# Patient Record
Sex: Male | Born: 1975 | Race: Black or African American | Hispanic: No | Smoking: Never smoker
Health system: Southern US, Community
[De-identification: ages and names within clinical notes are randomized; demographics above are authoritative.]

## PROBLEM LIST (undated history)

## (undated) DIAGNOSIS — G4730 Sleep apnea, unspecified: Secondary | ICD-10-CM

---

## 2014-04-21 ENCOUNTER — Encounter (HOSPITAL_BASED_OUTPATIENT_CLINIC_OR_DEPARTMENT_OTHER): Payer: Self-pay | Admitting: Emergency Medicine

## 2014-04-21 ENCOUNTER — Emergency Department (HOSPITAL_BASED_OUTPATIENT_CLINIC_OR_DEPARTMENT_OTHER): Payer: Self-pay

## 2014-04-21 ENCOUNTER — Emergency Department (HOSPITAL_BASED_OUTPATIENT_CLINIC_OR_DEPARTMENT_OTHER)
Admission: EM | Admit: 2014-04-21 | Discharge: 2014-04-21 | Disposition: A | Discharge status: Home / Self Care | Payer: Self-pay | Attending: Emergency Medicine | Admitting: Emergency Medicine

## 2014-04-21 DIAGNOSIS — S46909A Unspecified injury of unspecified muscle, fascia and tendon at shoulder and upper arm level, unspecified arm, initial encounter: Secondary | ICD-10-CM | POA: Insufficient documentation

## 2014-04-21 DIAGNOSIS — X500XXA Overexertion from strenuous movement or load, initial encounter: Secondary | ICD-10-CM | POA: Insufficient documentation

## 2014-04-21 DIAGNOSIS — S4980XA Other specified injuries of shoulder and upper arm, unspecified arm, initial encounter: Secondary | ICD-10-CM | POA: Insufficient documentation

## 2014-04-21 DIAGNOSIS — Y9289 Other specified places as the place of occurrence of the external cause: Secondary | ICD-10-CM | POA: Insufficient documentation

## 2014-04-21 DIAGNOSIS — IMO0002 Reserved for concepts with insufficient information to code with codable children: Secondary | ICD-10-CM | POA: Insufficient documentation

## 2014-04-21 DIAGNOSIS — S46911A Strain of unspecified muscle, fascia and tendon at shoulder and upper arm level, right arm, initial encounter: Secondary | ICD-10-CM

## 2014-04-21 DIAGNOSIS — Y939 Activity, unspecified: Secondary | ICD-10-CM | POA: Insufficient documentation

## 2014-04-21 MED ORDER — IBUPROFEN 800 MG PO TABS: 800.0000 mg | ORAL_TABLET | Freq: Three times a day (TID) | ORAL | Status: DC

## 2014-04-21 MED ORDER — HYDROCODONE-ACETAMINOPHEN 5-325 MG PO TABS: 2.0000 | ORAL_TABLET | ORAL | Status: DC | PRN

## 2014-04-21 NOTE — Discharge Instructions (Signed)

## 2014-04-21 NOTE — ED Provider Notes (Signed)
CSN: [id number]     Arrival date & time 04/21/14  1816 History   First MD Initiated Contact with Patient 04/21/14 1848     Chief Complaint  Patient presents with  . Shoulder Pain     (Consider location/radiation/quality/duration/timing/severity/associated sxs/prior Treatment) Patient is a 38 y.o. male presenting with shoulder pain. The history is provided by the patient. No language interpreter was used.  Shoulder Pain This is a new problem. Episode onset: 5 days. The problem occurs constantly. The problem has been gradually worsening. Associated symptoms include myalgias. Pertinent negatives include no joint swelling or numbness. Nothing aggravates the symptoms. He has tried nothing for the symptoms. The treatment provided no relief.   Pt reports he injured shoulder reaching up when he was ina dunking booth History reviewed. No pertinent past medical history. History reviewed. No pertinent past surgical history. No family history on file. History  Substance Use Topics  . Smoking status: Never Smoker   . Smokeless tobacco: Not on file  . Alcohol Use: No    Review of Systems  Musculoskeletal: Positive for myalgias. Negative for joint swelling.  Neurological: Negative for numbness.  All other systems reviewed and are negative.     Allergies  Review of patient's allergies indicates no known allergies.  Home Medications   Prior to Admission medications   Not on File   BP 117/77  Pulse 68  Temp(Src) 98 F (36.7 C) (Oral)  Resp 16  SpO2 100% Physical Exam  Nursing note and vitals reviewed. Constitutional: He is oriented to person, place, and time. He appears well-developed and well-nourished.  HENT:  Head: Normocephalic.  Eyes: EOM are normal.  Neck: Normal range of motion.  Pulmonary/Chest: Effort normal.  Abdominal: He exhibits no distension.  Musculoskeletal: He exhibits tenderness.  Pain to palpation of left shoulder,  Decreased range of motion,   nv and ns  intact,    Neurological: He is alert and oriented to person, place, and time.  Skin: Skin is warm.  Psychiatric: He has a normal mood and affect.    ED Course  Procedures (including critical care time) Labs Review Labs Reviewed - No data to display  Imaging Review No results found.   EKG Interpretation None      MDM   Final diagnoses:  Shoulder strain, right, initial encounter    Pt counseled on shoulder sprain. Ibuprofen Hydrocodone See Dr. Pearletha Forgehudnall for recheck in 1 week     Elson AreasLeslie K Sofia, PA-C 04/21/14 2127

## 2014-04-21 NOTE — ED Notes (Signed)
Patient transported to X-ray 

## 2014-04-21 NOTE — ED Notes (Signed)
Soreness, stiffness and pain to right shoulder since Friday. Pt hit shoulder on dunking booth

## 2014-04-22 NOTE — ED Provider Notes (Signed)
Medical screening examination/treatment/procedure(s) were performed by non-physician practitioner and as supervising physician I was immediately available for consultation/collaboration.   EKG Interpretation None        Junius ArgyleForrest S Ipek Westra, MD 04/22/14 [phone]

## 2015-02-23 IMAGING — CR DG SHOULDER 2+V*R*
3 series · 3 of 3 positions shown · non-contrast
Comparison: None.

CLINICAL DATA: Stiffness and soreness the right shoulder.  Injury.

EXAM:
RIGHT SHOULDER - 2+ VIEW

[w shoulder ap internal righ]
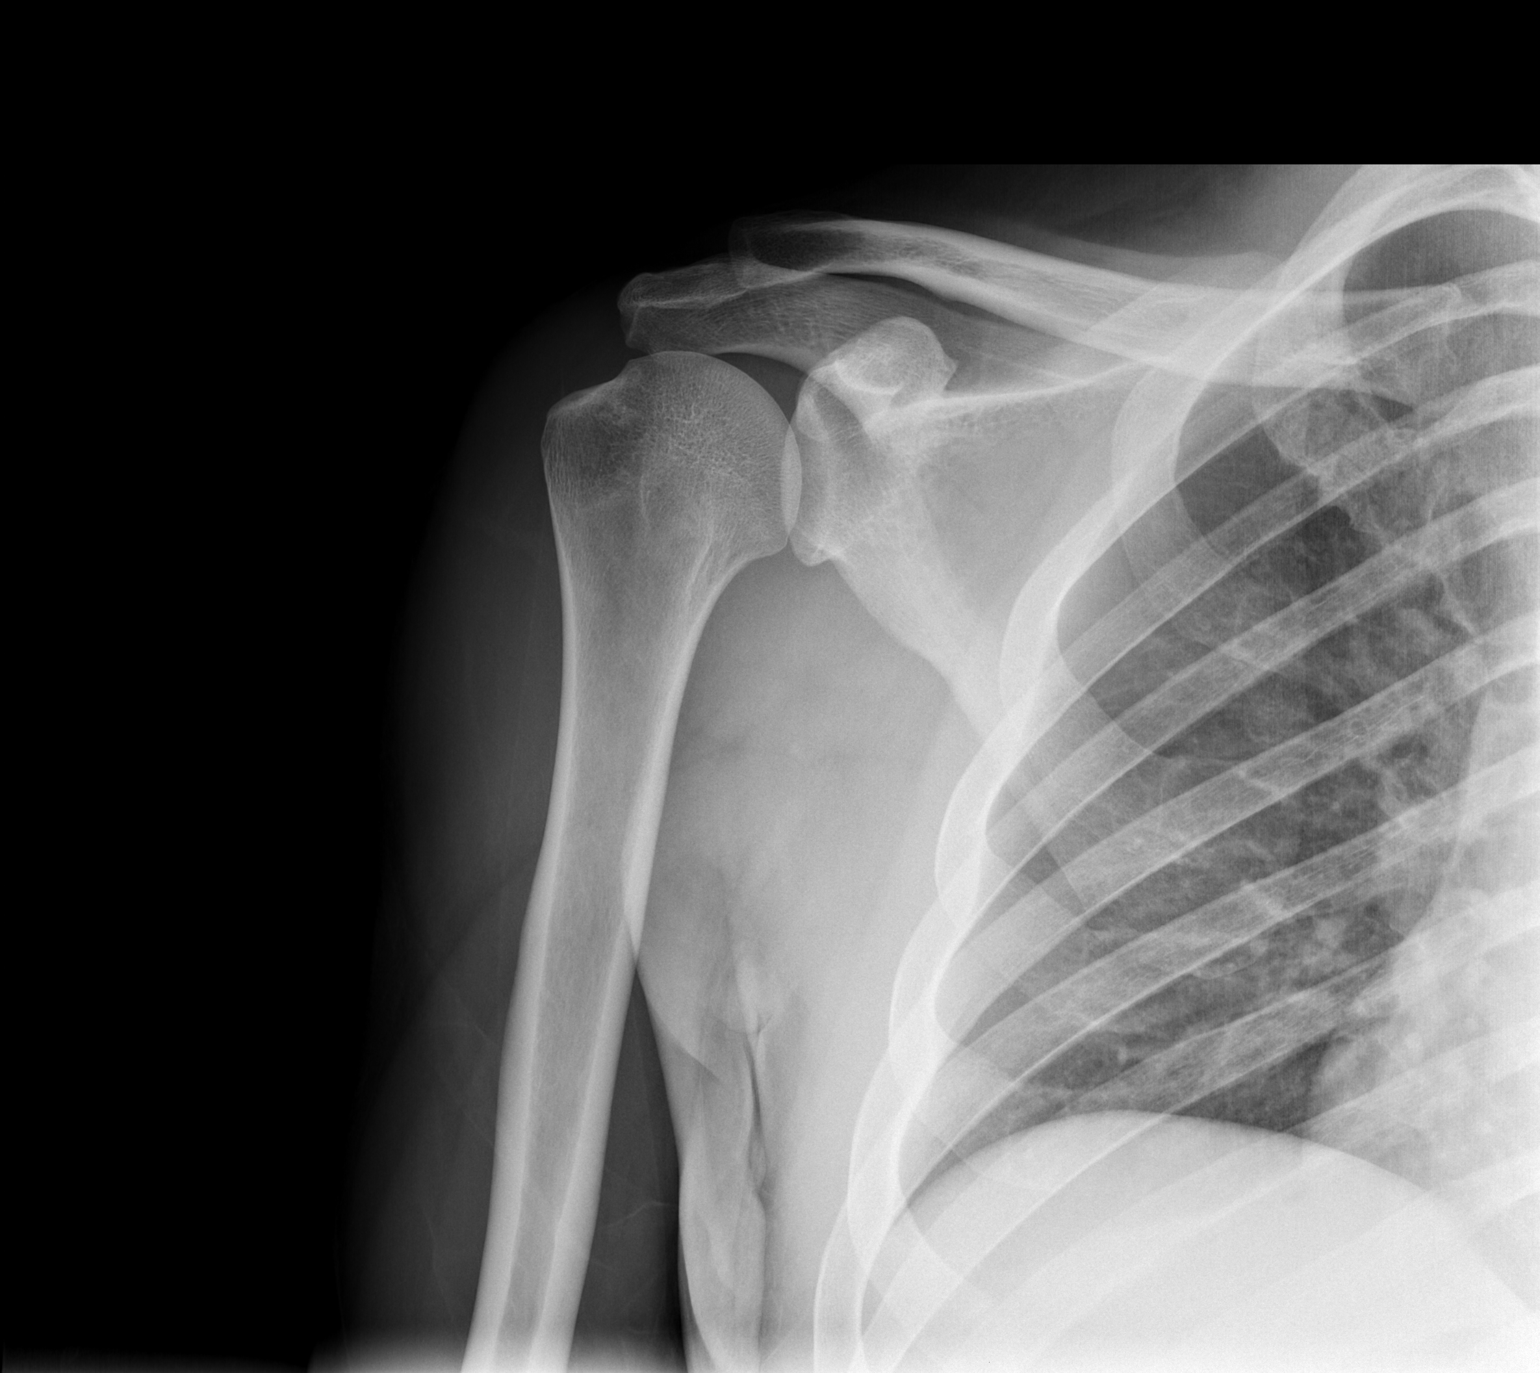

[w shoulder y view right]
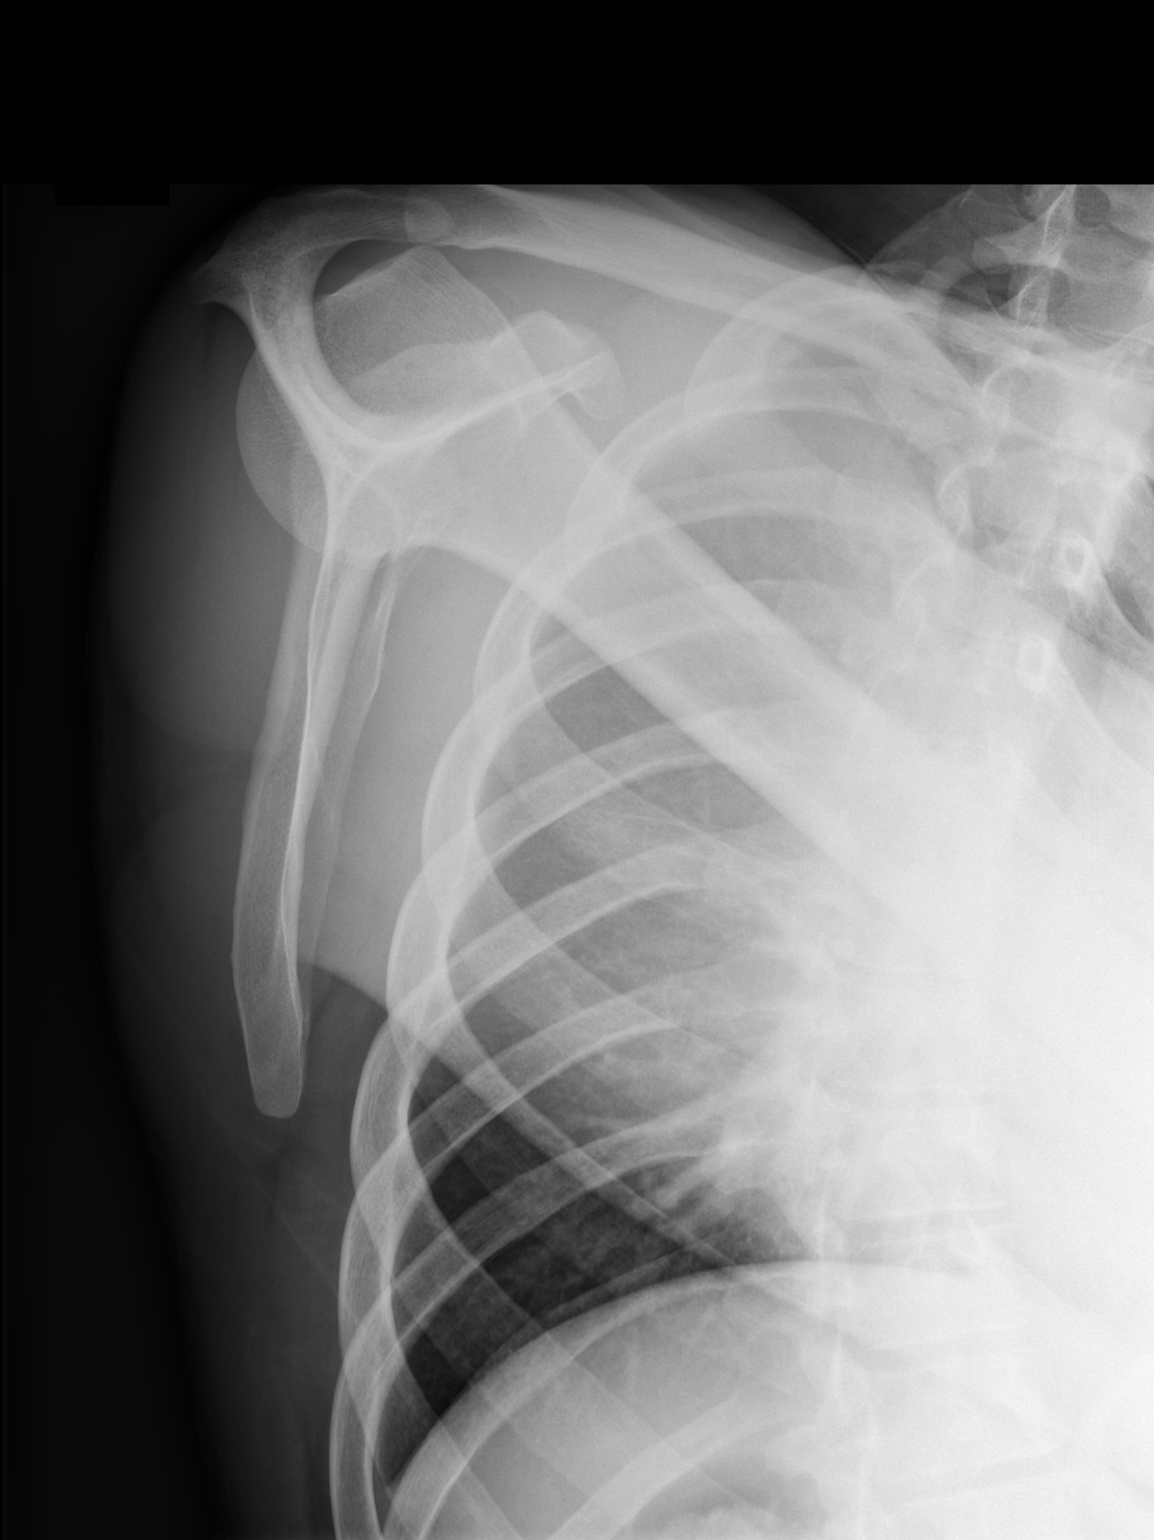

[x shoulder axillary right]
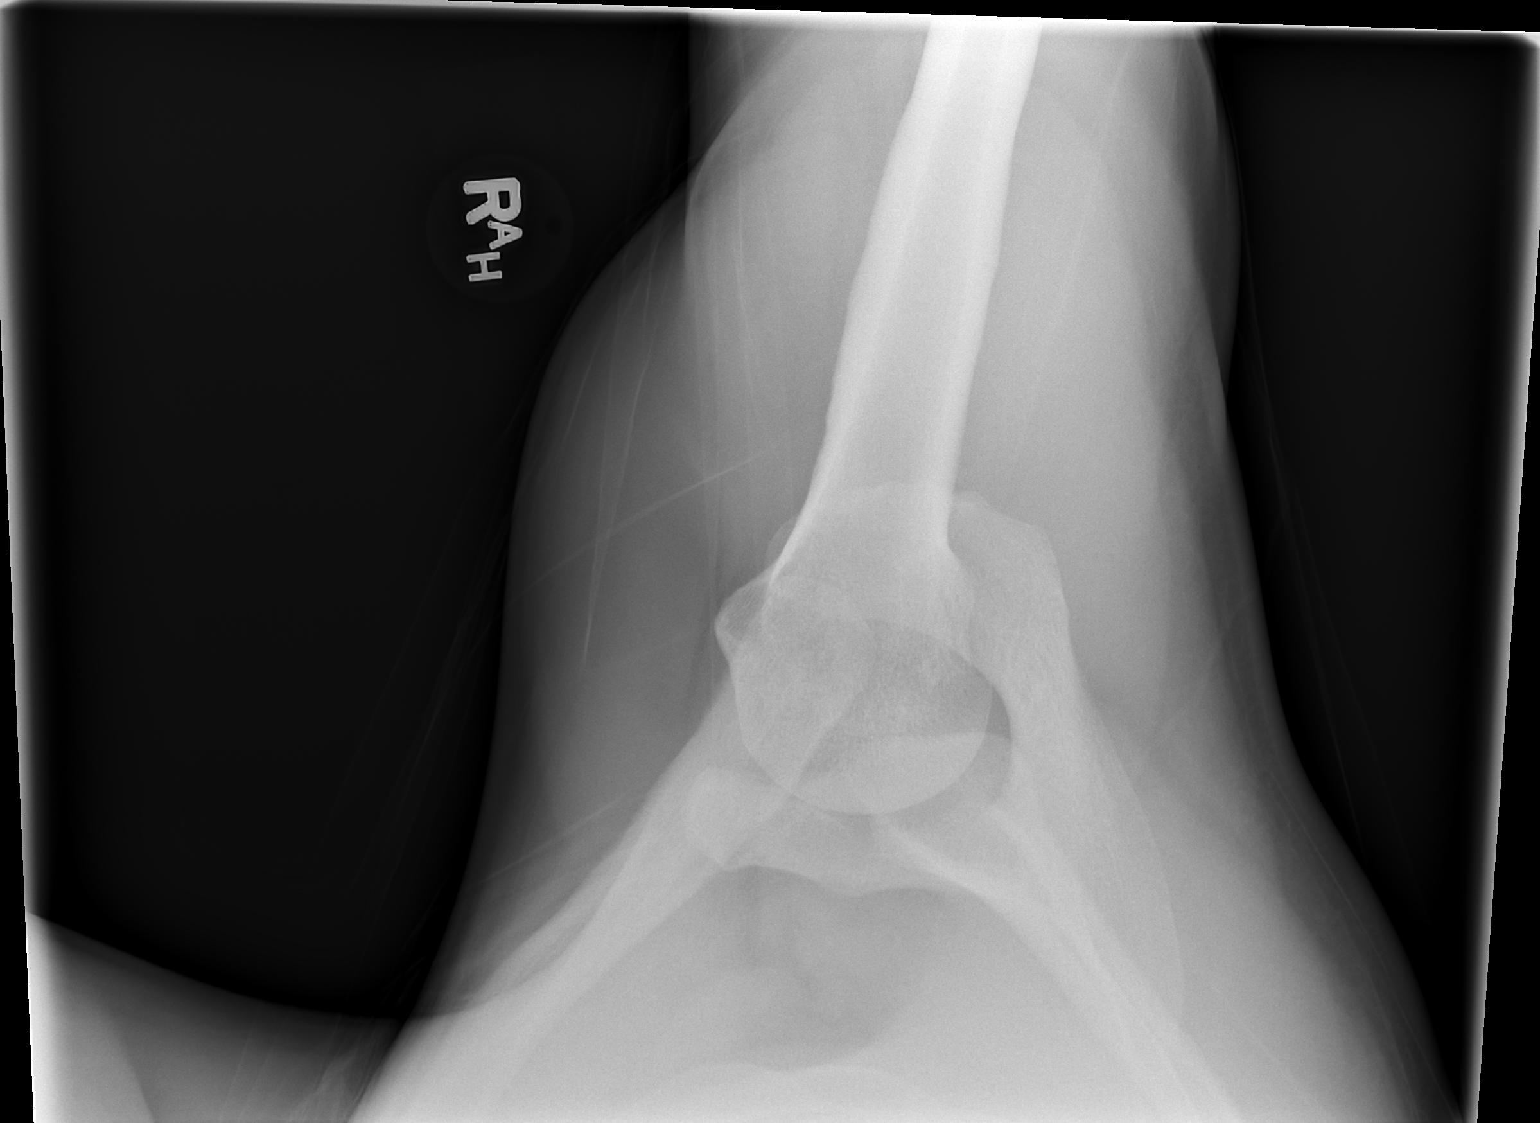

[3 of 3 positions shown; findings below may reference images not displayed]

FINDINGS: There is no evidence of fracture or dislocation. There is no
evidence of arthropathy or other focal bone abnormality. Soft
tissues are unremarkable.
IMPRESSION: Negative.

## 2019-12-05 ENCOUNTER — Ambulatory Visit: Payer: Self-pay

## 2019-12-06 ENCOUNTER — Ambulatory Visit: Payer: Self-pay | Attending: Internal Medicine

## 2019-12-06 DIAGNOSIS — Z23 Encounter for immunization: Secondary | ICD-10-CM

## 2019-12-06 NOTE — Progress Notes (Signed)
   Covid-19 Vaccination Clinic  Name:  Gavin Wiggins    MRN: [id number] DOB: [birth year]  12/06/2019  Mr. Varma was observed post Covid-19 immunization for 15 minutes without incident. He was provided with Vaccine Information Sheet and instruction to access the V-Safe system.   Mr. Sokolowski was instructed to call 911 with any severe reactions post vaccine: Marland Kitchen Difficulty breathing  . Swelling of face and throat  . A fast heartbeat  . A bad rash all over body  . Dizziness and weakness   Immunizations Administered    Name Date Dose VIS Date Route   Moderna COVID-19 Vaccine 12/06/2019 12:16 PM 0.5 mL 08/26/2019 Intramuscular   Manufacturer: Moderna   Lot: [id number]   NDC: 03524-818-59

## 2020-01-07 ENCOUNTER — Ambulatory Visit: Payer: Self-pay | Attending: Internal Medicine

## 2020-01-07 DIAGNOSIS — Z23 Encounter for immunization: Secondary | ICD-10-CM

## 2020-01-07 NOTE — Progress Notes (Signed)
   Covid-19 Vaccination Clinic  Name:  Nalu Troublefield    MRN: [id number] DOB: [birth year]  01/07/2020  Mr. Zettlemoyer was observed post Covid-19 immunization for 15 minutes without incident. He was provided with Vaccine Information Sheet and instruction to access the V-Safe system.   Mr. Attwood was instructed to call 911 with any severe reactions post vaccine: Marland Kitchen Difficulty breathing  . Swelling of face and throat  . A fast heartbeat  . A bad rash all over body  . Dizziness and weakness   Immunizations Administered    Name Date Dose VIS Date Route   Moderna COVID-19 Vaccine 01/07/2020  1:51 PM 0.5 mL 08/26/2019 Intramuscular   Manufacturer: Moderna   Lot: [id number]   NDC: 82081-388-71

## 2024-07-23 ENCOUNTER — Other Ambulatory Visit: Payer: Self-pay | Admitting: Medical Genetics

## 2024-09-19 ENCOUNTER — Emergency Department (HOSPITAL_BASED_OUTPATIENT_CLINIC_OR_DEPARTMENT_OTHER)
Admission: EM | Admit: 2024-09-19 | Discharge: 2024-09-19 | Disposition: A | Discharge status: Home / Self Care | Attending: Emergency Medicine | Admitting: Emergency Medicine

## 2024-09-19 ENCOUNTER — Emergency Department (HOSPITAL_BASED_OUTPATIENT_CLINIC_OR_DEPARTMENT_OTHER)

## 2024-09-19 DIAGNOSIS — Y9301 Activity, walking, marching and hiking: Secondary | ICD-10-CM | POA: Diagnosis not present

## 2024-09-19 DIAGNOSIS — S8001XA Contusion of right knee, initial encounter: Secondary | ICD-10-CM | POA: Insufficient documentation

## 2024-09-19 DIAGNOSIS — W1830XA Fall on same level, unspecified, initial encounter: Secondary | ICD-10-CM | POA: Insufficient documentation

## 2024-09-19 DIAGNOSIS — Y92094 Garage of other non-institutional residence as the place of occurrence of the external cause: Secondary | ICD-10-CM | POA: Diagnosis not present

## 2024-09-19 DIAGNOSIS — S76111A Strain of right quadriceps muscle, fascia and tendon, initial encounter: Secondary | ICD-10-CM | POA: Insufficient documentation

## 2024-09-19 DIAGNOSIS — M79651 Pain in right thigh: Secondary | ICD-10-CM | POA: Diagnosis present

## 2024-09-19 MED ORDER — IBUPROFEN 600 MG PO TABS
600.0000 mg | ORAL_TABLET | Freq: Four times a day (QID) | ORAL | 0 refills | Status: DC | PRN
  Filled 2024-09-19: qty 30, 8d supply, fill #0

## 2024-09-19 MED ORDER — HYDROCODONE-ACETAMINOPHEN 5-325 MG PO TABS
1.0000 | ORAL_TABLET | Freq: Four times a day (QID) | ORAL | 0 refills | Status: DC | PRN
  Filled 2024-09-19: qty 10, 3d supply, fill #0

## 2024-09-19 MED ORDER — IBUPROFEN 400 MG PO TABS
600.0000 mg | ORAL_TABLET | Freq: Once | ORAL | Status: AC
  Administered 2024-09-19: 600 mg via ORAL
  Filled 2024-09-19: qty 1

## 2024-09-19 MED ORDER — METHOCARBAMOL 500 MG PO TABS
1000.0000 mg | ORAL_TABLET | Freq: Once | ORAL | Status: AC
  Administered 2024-09-19: 1000 mg via ORAL
  Filled 2024-09-19: qty 2

## 2024-09-19 MED ORDER — IBUPROFEN 600 MG PO TABS: 600.0000 mg | ORAL_TABLET | Freq: Four times a day (QID) | ORAL | 0 refills | Status: AC | PRN

## 2024-09-19 MED ORDER — HYDROCODONE-ACETAMINOPHEN 5-325 MG PO TABS
1.0000 | ORAL_TABLET | Freq: Once | ORAL | Status: AC
  Administered 2024-09-19: 1 via ORAL
  Filled 2024-09-19: qty 1

## 2024-09-19 MED ORDER — HYDROCODONE-ACETAMINOPHEN 5-325 MG PO TABS: 1.0000 | ORAL_TABLET | Freq: Four times a day (QID) | ORAL | 0 refills | Status: DC | PRN

## 2024-09-19 MED ORDER — METHOCARBAMOL 750 MG PO TABS: 750.0000 mg | ORAL_TABLET | Freq: Four times a day (QID) | ORAL | 0 refills | Status: AC

## 2024-09-19 MED ORDER — METHOCARBAMOL 750 MG PO TABS
750.0000 mg | ORAL_TABLET | Freq: Four times a day (QID) | ORAL | 0 refills | Status: DC
  Filled 2024-09-19: qty 28, 7d supply, fill #0

## 2024-09-19 NOTE — ED Triage Notes (Signed)
 Patient reports falling in garage last night and striking right knee.  Worsening pain since then.  Ambulates with a steady gait, NAD noted. VSS

## 2024-09-19 NOTE — ED Provider Notes (Signed)
 " [place] [facility] Provider Note   CSN: [id number] Arrival date & time: 09/19/24  1540     Patient presents with: Knee Pain   Gavin Wiggins is a 48 y.o. male.   Patient to ED for evaluation of right knee pain and swelling since fall while walking last night. He fell directly onto the knee and reports symptoms of knee pain that extends to the thigh. He is able to walk. No hip pain. No wound.   The history is provided by the patient and the spouse. No language interpreter was used.  Knee Pain      Prior to Admission medications  Medication Sig Start Date End Date Taking? Authorizing Provider  HYDROcodone -acetaminophen  (NORCO/VICODIN) 5-325 MG tablet Take 1 tablet by mouth every 6 (six) hours as needed for severe pain (pain score 7-10). 09/19/24  Yes Kaitlen Redford, Margit, PA-C  ibuprofen  (ADVIL ) 600 MG tablet Take 1 tablet (600 mg total) by mouth every 6 (six) hours as needed. 09/19/24  Yes Jody Silas, Margit, PA-C  methocarbamol  (ROBAXIN ) 750 MG tablet Take 1 tablet (750 mg total) by mouth 4 (four) times daily. 09/19/24  Yes Odell Margit, PA-C    Allergies: Patient has no known allergies.    Review of Systems  Updated Vital Signs BP 134/87 (BP Location: Right Arm)   Pulse 69   Temp 98.1 F (36.7 C)   Resp 18   Ht 5' 10 (1.778 m)   Wt 108.9 kg   SpO2 99%   BMI 34.44 kg/m   Physical Exam Vitals and nursing note reviewed.  Constitutional:      Appearance: He is well-developed.  Pulmonary:     Effort: Pulmonary effort is normal.  Musculoskeletal:        General: Normal range of motion.     Cervical back: Normal range of motion.     Comments: Moderate swelling of the right knee anteriorly. No deformity. Joint stable. Tender to palpation that extends to mid anterior thigh. No deformity of muscle rupture. No quadriceps hematoma. No calf tenderness.   Skin:    General: Skin is warm and dry.  Neurological:     Mental Status: He is alert  and oriented to person, place, and time.     (all labs ordered are listed, but only abnormal results are displayed) Labs Reviewed - No data to display  EKG: None  Radiology: DG Knee Complete 4 Views Right Result Date: 09/19/2024 EXAM: 4 OR MORE VIEW(S) XRAY OF THE KNEE 09/19/2024 04:11:00 PM COMPARISON: None available. CLINICAL HISTORY: fall FINDINGS: BONES AND JOINTS: No acute fracture. No malalignment. Limited evaluation for joint effusion -possible. SOFT TISSUES: The soft tissues are unremarkable. IMPRESSION: 1. No evidence of acute osseous traumatic injury. 2. Possible joint effusion, with limited evaluation. Electronically signed by: Morgane Naveau MD 09/19/2024 05:11 PM EST RP Workstation: [id number]     Procedures   Medications Ordered in the ED  HYDROcodone -acetaminophen  (NORCO/VICODIN) 5-325 MG per tablet 1 tablet (1 tablet Oral Given 09/19/24 1947)  ibuprofen  (ADVIL ) tablet 600 mg (600 mg Oral Given 09/19/24 1947)  methocarbamol  (ROBAXIN ) tablet 1,000 mg (1,000 mg Oral Given 09/19/24 1946)    Clinical Course as of 09/19/24 1953  Fri Sep 19, 2024  1947 Right knee and thigh pain after fall last night. Imaging of the knee is negative for fracture. No MSK rupture suspected. Knee joint stable. Thigh tender but suspect soft tissue strain only. Hip nontender. Recommended ACE wrap, ice, elevate, anti-inflammatories. Ortho  follow up if no better in one week.  [SU]    Clinical Course User Index [SU] Odell Balls, PA-C                                 Medical Decision Making Amount and/or Complexity of Data Reviewed Radiology: ordered.  Risk Prescription drug management.        Final diagnoses:  Contusion of right knee, initial encounter  Quadriceps strain, right, initial encounter    ED Discharge Orders          Ordered    HYDROcodone -acetaminophen  (NORCO/VICODIN) 5-325 MG tablet  Every 6 hours PRN        09/19/24 1953    ibuprofen  (ADVIL ) 600 MG tablet  Every  6 hours PRN        09/19/24 1953    methocarbamol  (ROBAXIN ) 750 MG tablet  4 times daily        09/19/24 1953               Odell Balls, DEVONNA 09/19/24 1953    Dreama Longs, MD 09/20/24 1206  "

## 2024-09-19 NOTE — Discharge Instructions (Signed)
 As we discussed, use the ACE wrap to the knee when active. Ice and elevate the leg as much as possible to reduce swelling. Take the medications as prescribed.   If pain and swelling are no better in 1 week, call Dr. Georgina with orthopedics to schedule a recheck appointment in office.

## 2024-09-22 ENCOUNTER — Other Ambulatory Visit (HOSPITAL_BASED_OUTPATIENT_CLINIC_OR_DEPARTMENT_OTHER): Payer: Self-pay

## 2024-09-30 ENCOUNTER — Other Ambulatory Visit: Payer: Self-pay | Admitting: Medical Genetics

## 2024-09-30 DIAGNOSIS — Z006 Encounter for examination for normal comparison and control in clinical research program: Secondary | ICD-10-CM

## 2024-10-02 ENCOUNTER — Other Ambulatory Visit: Payer: Self-pay

## 2024-10-02 ENCOUNTER — Encounter: Payer: Self-pay | Admitting: Sports Medicine

## 2024-10-02 ENCOUNTER — Ambulatory Visit: Admitting: Sports Medicine

## 2024-10-02 DIAGNOSIS — M25361 Other instability, right knee: Secondary | ICD-10-CM

## 2024-10-02 DIAGNOSIS — M25461 Effusion, right knee: Secondary | ICD-10-CM | POA: Diagnosis not present

## 2024-10-02 DIAGNOSIS — M25561 Pain in right knee: Secondary | ICD-10-CM | POA: Diagnosis not present

## 2024-10-02 MED ORDER — BUPIVACAINE HCL 0.25 % IJ SOLN
2.0000 mL | INTRAMUSCULAR | Status: AC | PRN
  Administered 2024-10-02: 2 mL via INTRA_ARTICULAR

## 2024-10-02 MED ORDER — LIDOCAINE HCL 1 % IJ SOLN
6.0000 mL | INTRAMUSCULAR | Status: AC | PRN
  Administered 2024-10-02: 6 mL

## 2024-10-02 MED ORDER — METHYLPREDNISOLONE ACETATE 40 MG/ML IJ SUSP
40.0000 mg | INTRAMUSCULAR | Status: AC | PRN
  Administered 2024-10-02: 40 mg via INTRA_ARTICULAR

## 2024-10-02 NOTE — Progress Notes (Signed)
 "  Gavin Wiggins - 49 y.o. male MRN [id number]  Date of birth: Dec 20, 1975  Office Visit Note: Visit Date: 10/02/2024 PCP: Pcp, No Referred by: Burnetta Brunet, DO  Subjective: Chief Complaint  Patient presents with   Right Knee - Pain   HPI: Gavin Wiggins is a pleasant 49 y.o. male who presents today for right knee pain after fall on 12/25, note reviewed from ED on 09/19/24.  Discussed the use of AI scribe software for clinical note transcription with the patient, who gave verbal consent to proceed.  History of Present Illness Gavin Wiggins is a 49 year old male who presents with acute right knee pain and swelling following a recent fall.  He sustained a direct impact to the right knee when he fell onto concrete and immediately had pain, swelling, and a shifting sensation in the knee. Pain has persisted with substantial swelling and radiates down the shin.  He reports worsened intermittent clicking, catching, and popping in the right knee since the fall, with episodes of instability and the knee giving out during walking and turning. Both medial and lateral knee are painful with movement, and he has significant pain and stiffness limiting knee flexion.  He has used ibuprofen  regularly and occasionally a muscle relaxer, with best relief from ibuprofen . Prior knee radiographs showed no fracture.   Pertinent ROS were reviewed with the patient and found to be negative unless otherwise specified above in HPI.   Assessment & Plan: Visit Diagnoses:  1. Acute pain of right knee   2. Effusion, right knee   3. Instability of right knee joint     Assessment and Plan Assessment & Plan Acute right knee pain and internal derangement s/p fall Right knee effusion - blood, colored Right knee Instability  Acute right knee pain with effusion post-trauma, suggestive of internal derangement. Blood-tinged synovial fluid indicates possible meniscal or ligamentous injury. Radiographs negative for  fracture. MRI needed for further evaluation. - Performed right knee aspiration under local anesthesia, yielding large amount, 67 cc of blood-tinged fluid --> subsequent CSI injection - Applied compression wrap to the right knee. - Advised continued use of NSAIDs (Ibuprofen ) for analgesia and anti-inflammatory effect. May use his Norco 5-325mg  q 6-12 hours for breakthrough pain only, do not take long-term - May discontinue Robaxin , likely not helpful - Recommended ice application to reduce swelling 2x daily. - Instructed to limit activity and avoid strenuous use of the right knee. - Ordered right knee MRI to assess for meniscal or ligamentous injury --> necessary given ROM blocking, significant pain, instability, and bloody effusion with concern for intra-articular pathology - Scheduled follow-up one week post-MRI to review imaging and determine further management.   Follow-up: Return for F/u 1-week after MRI to review and discuss next steps.   Meds & Orders: No orders of the defined types were placed in this encounter.   Orders Placed This Encounter  Procedures   Large Joint Inj   US  Extrem Low Right Ltd   MR Knee Right w/o contrast     Procedures: Large Joint Inj: R knee on 10/02/2024 3:37 PM Indications: joint swelling and pain Details: 18 G 1.5 in needle, superolateral approach Medications: 6 mL lidocaine  1 %; 2 mL bupivacaine  0.25 %; 40 mg methylPREDNISolone  acetate 40 MG/ML Aspirate: 67 mL clear and blood-tinged Outcome: tolerated well, no immediate complications  US -guided Knee Aspiration, Right: After discussion on risks/benefits/indications was provided, informed verbal consent was obtained and a timeout was performed, patient was lying supine on  exam table with knee bolster pillow in place. The knee was prepped with Chloraprep and multiple alcohol swabs. Utilizing superolateral approach, approximately 5 mL of lidocaine  1% was used for local anesthesia. Then using ultrasound  guidance via a transverse-axis and in-plane approach, an 18g, 1.5 needle was inserted and 67 mL of clear, blood-colored fluid was aspirated from the knee joint. Utilizing the same portal and a sterile syringe swap, the knee joint was then  injected under ultrasound guidance with 2:2 bupivicaine:depomedrol with flow of the injectate visualized going into the knee joint.  Patient tolerated procedure well without immediate complications.   Procedure, treatment alternatives, risks and benefits explained, specific risks discussed. Consent was given by the patient. Immediately prior to procedure a time out was called to verify the correct patient, procedure, equipment, support staff and site/side marked as required. Patient was prepped and draped in the usual sterile fashion.          Clinical History: No specialty comments available.  He reports that he has never smoked. He does not have any smokeless tobacco history on file. No results for input(s): HGBA1C, LABURIC in the last 8760 hours.  Objective:    Physical Exam  Gen: Well-appearing, in no acute distress; non-toxic CV: Well-perfused. Warm.  Resp: Breathing unlabored on room air; no wheezing. Psych: Fluid speech in conversation; appropriate affect; normal thought process  *MSK/Ortho Exam: Physical Exam MUSCULOSKELETAL: Limited range of motion and swelling in the knee. Ligaments intact with no abnormalities.  Right knee: There is generalized tenderness of the medial and lateral joint line.  There is hesitancy with range of motion, patient has a significant antalgic gait.  There is a large effusion present on the knee, no redness or cellulitis.  There is no giveway with anterior drawer or Lachman although guarding is present.  There is pain with McMurray's testing.  Range of motion is restricted from about 5-70 degrees with significant pain and blocking with any further flexion. Guarding present.  Imaging:  US  Extrem Low Right  Ltd Limited musculoskeletal ultrasound of the right knee shows a large  effusion with both hypoechoic and isoechoic fluid pocket, possibly  indicative of partially coagulated bloody effusion.  No patellar cortical  regularity.  Impression: Large effusion  *Procedurally successful ultrasound-guided right knee aspiration/injection  *Independent review and interpretation of 4 view right knee x-ray from 09/19/2024 was performed by myself today.  There is minimal medial tibiofemoral arthritic change but certainly no advanced arthritic change tricompartmentally.  There is likely an moderate joint effusion noted on lateral film.  No acute fracture obviously noted.  Narrative & Impression  EXAM: 4 OR MORE VIEW(S) XRAY OF THE KNEE 09/19/2024 04:11:00 PM   COMPARISON: None available.   CLINICAL HISTORY: fall   FINDINGS:   BONES AND JOINTS: No acute fracture. No malalignment. Limited evaluation for joint effusion -possible.   SOFT TISSUES: The soft tissues are unremarkable.   IMPRESSION: 1. No evidence of acute osseous traumatic injury. 2. Possible joint effusion, with limited evaluation.   Electronically signed by: Morgane Naveau MD 09/19/2024 05:11 PM EST RP Workstation: [id number]    Past Medical/Family/Surgical/Social History: Medications & Allergies reviewed per EMR, new medications updated. There are no active problems to display for this patient.  History reviewed. No pertinent past medical history. History reviewed. No pertinent family history. History reviewed. No pertinent surgical history. Social History   Occupational History   Not on file  Tobacco Use   Smoking status: Never   Smokeless tobacco: Not  on file  Substance and Sexual Activity   Alcohol use: No   Drug use: No   Sexual activity: Not on file   "

## 2024-10-02 NOTE — Progress Notes (Signed)
 Patient says that he fell on Christmas day, and felt a shift in the knee when he fell. He did have swelling at that time and says that he still has some swelling. Patient states that in the time since his fall, his pain has moved into his shin, and he has had some swelling in his ankle as well. He did go to the ED, where he was prescribed Hydrocodone , Ibuprofen , and Robaxin .

## 2024-10-07 ENCOUNTER — Encounter: Payer: Self-pay | Admitting: Sports Medicine

## 2024-10-08 ENCOUNTER — Ambulatory Visit
Admission: RE | Admit: 2024-10-08 | Discharge: 2024-10-08 | Disposition: A | Discharge status: Home / Self Care | Source: Ambulatory Visit | Attending: Sports Medicine | Admitting: Sports Medicine

## 2024-10-08 DIAGNOSIS — M25361 Other instability, right knee: Secondary | ICD-10-CM

## 2024-10-08 DIAGNOSIS — M25561 Pain in right knee: Secondary | ICD-10-CM

## 2024-10-08 DIAGNOSIS — M25461 Effusion, right knee: Secondary | ICD-10-CM

## 2024-10-09 ENCOUNTER — Ambulatory Visit: Admitting: Orthopedic Surgery

## 2024-10-15 ENCOUNTER — Ambulatory Visit: Admitting: Sports Medicine

## 2024-10-15 ENCOUNTER — Encounter: Payer: Self-pay | Admitting: Sports Medicine

## 2024-10-15 ENCOUNTER — Other Ambulatory Visit: Payer: Self-pay

## 2024-10-15 DIAGNOSIS — M25561 Pain in right knee: Secondary | ICD-10-CM

## 2024-10-15 DIAGNOSIS — S76111A Strain of right quadriceps muscle, fascia and tendon, initial encounter: Secondary | ICD-10-CM

## 2024-10-15 NOTE — Progress Notes (Signed)
 "  Gavin Wiggins - 49 y.o. male MRN [id number]  Date of birth: [birth year]  Office Visit Note: Visit Date: 10/15/2024 PCP: Pcp, No Referred by: No ref. provider found  Subjective: Chief Complaint  Patient presents with   Right Knee - Follow-up   HPI: Gavin Wiggins is a pleasant 49 y.o. male who presents today for follow-up of right knee injury.   DOI: 09/19/24 - fell directly onto the knee. Seen in [facility] ED.  Discussed the use of AI scribe software for clinical note transcription with the patient, who gave verbal consent to proceed.  History of Present Illness Gavin Wiggins is a 49 year old male with a high-grade partial tear of the right quadriceps tendon who presents for follow-up of persistent right knee pain and functional limitation.  One month ago he sustained a significant fall with immediate right knee pain, swelling, and a sensation of structural disruption. Since then he has had persistent right knee swelling, stiffness, and difficulty with active extension, with pain and limitation especially during activities requiring knee extension.  He had joint aspiration and an injection after the injury, which partially improved swelling and mobility. He still has intermittent swelling and stiffness, with only modest improvement in range of motion and walking. He remains limited in daily activities.  He has tried heel slides and gentle range of motion exercises at home but can only perform them to a limited extent. He often needs to lift his right leg with his hands for tasks like placing it on an ottoman. He cannot perform a straight leg raise on the right due to pain and weakness, though he can bend and partially straighten the knee. The left knee is asymptomatic.  Recent MRI showed a high-grade partial tear of the right quadriceps tendon with some medial and lateral fibers attached but complete rupture of rectus femoris.  *Pertinent surgical history: - No tobacco use or  cigarette smoking - No history of CAD, nondiabetic - No personal or family history of clotting disorder  Pertinent ROS were reviewed with the patient and found to be negative unless otherwise specified above in HPI.   Assessment & Plan: Visit Diagnoses:  1. Quadriceps tendon rupture, right, initial encounter   2. Acute pain of right knee    Assessment & Plan Quadriceps tendon rupture, right Complete disruption of one slip of the right quadriceps tendon confirmed by MRI and ultrasound. Significant functional impairment with inability to perform a straight leg raise and a degree of extensor lag. Minimal activation with extension in seated position - Reviewed MRI and ultrasound findings with him. - Discussed injury mechanism and current functional limitations. - Ultrasound demonstrates about a 1 to 1.2 cm retraction at the central slip of distal quadriceps tendon about 2cm proximal to patella.  - Given evidence of rupture, inability to perform straight leg raise any degree of extensor lag, surgery is indicated for best functional outcome - Dr. Addie did come in today and briefly discussed the overview of the surgery as well as expected recovery, and rehabilitation for distal quadricep tendon repair --> his team will contact Gavin Wiggins about setting up surgery here in the near future, prefer within the week if possible - He will continue his Norco 325 mg as needed for breakthrough pain, okay for ibuprofen  600 in the interim - Crutches provided today, he may continue his soft knee brace for support.  Will likely transition to knee immobilizer status post surgery for first 2-3 weeks   Extensor lag right  knee Persistent extensor lag with inability to lift the leg off the table or perform a straight leg raise, indicating significant quadriceps tendon dysfunction. Critical factor for surgical intervention decision. - Discussed with him that inability to perform a straight leg raise is a critical finding  influencing surgical decision-making. - Dr. Addie did come in today and briefly discussed the overview of the surgery as well as expected recovery, and rehabilitation for distal quadricep tendon repair   Follow-up: Return for will have Dr. Brion team contact for surgery .   Meds & Orders: No orders of the defined types were placed in this encounter.   Orders Placed This Encounter  Procedures   US  Extrem Low Right Ltd     Procedures: No procedures performed      Clinical History: No specialty comments available.  He reports that he has never smoked. He does not have any smokeless tobacco history on file. No results for input(s): HGBA1C, LABURIC in the last 8760 hours.  Objective:    Physical Exam  Gen: Well-appearing, in no acute distress; non-toxic CV: Well-perfused. Warm.  Resp: Breathing unlabored on room air; no wheezing. Psych: Fluid speech in conversation; appropriate affect; normal thought process  *MSK/Ortho Exam: Physical Exam MUSCULOSKELETAL: Right knee unable to perform straight leg raise. Right knee able to bend and straighten. Swelling present in the right knee. Mild extensor lag present. + Antalgic gait.  Imaging:  US  Extrem Low Right Ltd Limited musculoskeletal ultrasound of the right knee and quadricep tendon  was performed today.  Evaluation shows intact fibers at the distal  insertion, although approximately 2-3 cm proximal to this there is  complete rupture of the quadriceps tendon, in the region of the rectus  femoris.  There is approximately 1.02-1.2 cm of retraction noted.  There  is a small reactive effusion present.           Narrative & Impression  CLINICAL DATA:  Knee trauma, internal derangement suspected, xray done   Chronic right knee pain with swelling since falling 09/18/2024.   EXAM: MRI OF THE RIGHT KNEE WITHOUT CONTRAST   TECHNIQUE: Multiplanar, multisequence MR imaging of the knee was performed. No intravenous  contrast was administered.   COMPARISON:  Radiographs 09/19/2024   FINDINGS: MENISCI   Medial meniscus:  Intact with normal morphology.   Lateral meniscus:  Intact with normal morphology.   LIGAMENTS   Cruciates: The anterior and posterior cruciate ligaments are intact.   Collaterals: The medial and lateral collateral ligament complexes are intact.   CARTILAGE   Patellofemoral:  Preserved.   Medial:  Preserved.   Lateral:  Preserved.   MISCELLANEOUS   Joint:  Minimal joint effusion.   Popliteal Fossa: The popliteus muscle and tendon are intact. No significant Baker's cyst.   Extensor Mechanism: There is a high-grade partial tear of the distal quadriceps tendon, involving the superficial central fibers and components of the vastus medialis, located approximately 2 cm proximal to the patellar insertion. The superficial components of the rectus femoris tendon are completely ruptured. The central deep and lateral peripheral components are intact.There is laxity of the patellar tendon which is intact. The patellar retinacula and medial patellofemoral ligament are intact.   Bones: No evidence of acute fracture, dislocation or aggressive osseous lesion.   Other: Prominent subcutaneous edema anteriorly with a small amount of ill-defined fluid within the quadriceps tendon tear defect.   IMPRESSION: 1. High-grade partial tear of the distal quadriceps tendon as described, primarily involving the rectus femoris  and vastus medialis components. 2. The menisci, cruciate and collateral ligaments are intact. 3. No acute osseous findings or significant arthropathic changes.     Electronically Signed   By: Elsie Perone M.D.   On: 10/08/2024 16:58      Past Medical/Family/Surgical/Social History: Medications & Allergies reviewed per EMR, new medications updated. There are no active problems to display for this patient.  History reviewed. No pertinent past medical  history. History reviewed. No pertinent family history. History reviewed. No pertinent surgical history. Social History   Occupational History   Not on file  Tobacco Use   Smoking status: Never   Smokeless tobacco: Not on file  Substance and Sexual Activity   Alcohol use: No   Drug use: No   Sexual activity: Not on file   "

## 2024-10-16 ENCOUNTER — Ambulatory Visit: Admitting: Sports Medicine

## 2024-10-16 ENCOUNTER — Telehealth: Payer: Self-pay | Admitting: Orthopedic Surgery

## 2024-10-17 ENCOUNTER — Telehealth: Payer: Self-pay

## 2024-10-17 NOTE — Telephone Encounter (Signed)
-----   Message from [place] sent at 10/16/2024  5:59 PM EST ----- Regarding: SOONER SURGERY DATE Patient is currently scheduled for right quad rupture repair (CPT [id number]).  Surgery date is 10-30-24 at [facility].  Surgery will need to remain at a [facility] since patient's coverage is [facility].  Patient is agreeable to any sooner surgery date being offered, including Monday 10/20/24 should there be any cancellations due to the weather.  Patient does live in [place] [facility] and will have transportation to and from the hospital.  Authorization for for this surgery is not required per [facility] look up tool.  If there are any cancellations at a [facility] prior to 10-30-24,  please contact patient at [phone]  to offer sooner date.  [employer] will also need to be notified. Patient is not diabetic, does not have latex allergy, is not on any blood thinner medication, and is not taking any weight loss injections.  Patient does have sleep apnea and wears CPAP.

## 2024-10-20 ENCOUNTER — Encounter (HOSPITAL_COMMUNITY): Payer: Self-pay | Admitting: Orthopedic Surgery

## 2024-10-20 ENCOUNTER — Other Ambulatory Visit: Payer: Self-pay

## 2024-10-20 LAB — GENECONNECT MOLECULAR SCREEN: Genetic Analysis Overall Interpretation: NEGATIVE

## 2024-10-20 NOTE — Progress Notes (Signed)
 SDW call  Patient was given pre-op instructions over the phone. Patient verbalized understanding of instructions provided.  Denied any SOB, fever or cough   PCP - Denies Cardiologist -  Pulmonary:    PPM/ICD - denies Device Orders - na Rep Notified - na   Chest x-ray -  EKG -   Stress Test - ECHO -  Cardiac Cath -   Sleep Study/sleep apnea/CPAP: denies  Non-diabetic  Blood Thinner Instructions: denies Aspirin  Instructions:denies   ERAS Protcol - Clears until 1230  Anesthesia review: No  Your procedure is scheduled on Tuesday October 21, 2024  Report to [facility] Main Entrance A at  1300 PM., then check in with the Admitting office.  Call this number if you have problems the morning of surgery:  [phone]   If you have any questions prior to your surgery date call [phone]: Open Monday-Friday 8am-4pm If you experience any cold or flu symptoms such as cough, fever, chills, shortness of breath, etc. between now and your scheduled surgery, please notify us  at the above number    Remember:  Do not eat after midnight the night before your surgery  You may drink clear liquids until  1230 the day of your surgery.   Clear liquids allowed are: Water, Non-Citrus Juices (without pulp), Carbonated Beverages, Clear Tea, Black Coffee ONLY (NO MILK, CREAM OR POWDERED CREAMER of any kind), and Gatorade   Take these medicines if needed the morning of surgery with A SIP OF WATER:  Norco, robaxin   As of today, STOP taking any Aspirin  (unless otherwise instructed by your surgeon) Aleve, Naproxen, Ibuprofen , Motrin , Advil , Goody's, BC's, all herbal medications, fish oil, and all vitamins.

## 2024-10-20 NOTE — Telephone Encounter (Signed)
 We have him on for tomorrow  cone said they would call

## 2024-10-21 ENCOUNTER — Encounter: Admission: RE | Disposition: A | Payer: Self-pay | Attending: Orthopedic Surgery

## 2024-10-21 ENCOUNTER — Encounter: Payer: Self-pay | Admitting: Sports Medicine

## 2024-10-21 ENCOUNTER — Encounter (HOSPITAL_COMMUNITY): Payer: Self-pay | Admitting: Orthopedic Surgery

## 2024-10-21 ENCOUNTER — Ambulatory Visit (HOSPITAL_COMMUNITY)

## 2024-10-21 ENCOUNTER — Ambulatory Visit (HOSPITAL_COMMUNITY)
Admission: RE | Admit: 2024-10-21 | Discharge: 2024-10-21 | Disposition: A | Discharge status: Home / Self Care | Attending: Orthopedic Surgery | Admitting: Orthopedic Surgery

## 2024-10-21 DIAGNOSIS — G4730 Sleep apnea, unspecified: Secondary | ICD-10-CM | POA: Insufficient documentation

## 2024-10-21 DIAGNOSIS — Z01818 Encounter for other preprocedural examination: Secondary | ICD-10-CM

## 2024-10-21 DIAGNOSIS — S76111D Strain of right quadriceps muscle, fascia and tendon, subsequent encounter: Secondary | ICD-10-CM

## 2024-10-21 DIAGNOSIS — X58XXXA Exposure to other specified factors, initial encounter: Secondary | ICD-10-CM | POA: Diagnosis not present

## 2024-10-21 DIAGNOSIS — F17290 Nicotine dependence, other tobacco product, uncomplicated: Secondary | ICD-10-CM | POA: Diagnosis not present

## 2024-10-21 DIAGNOSIS — S76111A Strain of right quadriceps muscle, fascia and tendon, initial encounter: Secondary | ICD-10-CM | POA: Insufficient documentation

## 2024-10-21 LAB — CBC
HCT: 40.2 % (ref 39.0–52.0)
Hemoglobin: 13.6 g/dL (ref 13.0–17.0)
MCH: 32.3 pg (ref 26.0–34.0)
MCHC: 33.8 g/dL (ref 30.0–36.0)
MCV: 95.5 fL (ref 80.0–100.0)
Platelets: 181 10*3/uL (ref 150–400)
RBC: 4.21 MIL/uL — ABNORMAL LOW (ref 4.22–5.81)
RDW: 12.6 % (ref 11.5–15.5)
WBC: 3.6 10*3/uL — ABNORMAL LOW (ref 4.0–10.5)
nRBC: 0 % (ref 0.0–0.2)

## 2024-10-21 MED ORDER — OXYCODONE HCL 5 MG PO TABS
ORAL_TABLET | ORAL | Status: AC
  Filled 2024-10-21: qty 1

## 2024-10-21 MED ORDER — LIDOCAINE HCL (CARDIAC) PF 100 MG/5ML IV SOSY
PREFILLED_SYRINGE | INTRAVENOUS | Status: DC | PRN
  Administered 2024-10-21: 40 mg via INTRATRACHEAL

## 2024-10-21 MED ORDER — PROPOFOL 10 MG/ML IV BOLUS
INTRAVENOUS | Status: DC | PRN
  Administered 2024-10-21: 50 mg via INTRAVENOUS
  Administered 2024-10-21: 200 mg via INTRAVENOUS

## 2024-10-21 MED ORDER — FENTANYL CITRATE (PF) 100 MCG/2ML IJ SOLN
INTRAMUSCULAR | Status: AC
  Filled 2024-10-21: qty 2

## 2024-10-21 MED ORDER — BUPIVACAINE-EPINEPHRINE (PF) 0.5% -1:200000 IJ SOLN
INTRAMUSCULAR | Status: DC | PRN
  Administered 2024-10-21: 30 mL via PERINEURAL

## 2024-10-21 MED ORDER — MIDAZOLAM HCL 2 MG/2ML IJ SOLN
INTRAMUSCULAR | Status: AC
  Filled 2024-10-21: qty 2

## 2024-10-21 MED ORDER — FENTANYL CITRATE (PF) 100 MCG/2ML IJ SOLN
INTRAMUSCULAR | Status: DC | PRN
  Administered 2024-10-21: 50 ug via INTRAVENOUS
  Administered 2024-10-21: 100 ug via INTRAVENOUS

## 2024-10-21 MED ORDER — ONDANSETRON HCL 4 MG/2ML IJ SOLN
INTRAMUSCULAR | Status: AC
  Filled 2024-10-21: qty 2

## 2024-10-21 MED ORDER — POVIDONE-IODINE 7.5 % EX SOLN
Freq: Once | CUTANEOUS | Status: DC
  Filled 2024-10-21: qty 118

## 2024-10-21 MED ORDER — VANCOMYCIN HCL 1000 MG IV SOLR
INTRAVENOUS | Status: AC
  Filled 2024-10-21: qty 20

## 2024-10-21 MED ORDER — LIDOCAINE 2% (20 MG/ML) 5 ML SYRINGE
INTRAMUSCULAR | Status: AC
  Filled 2024-10-21: qty 5

## 2024-10-21 MED ORDER — MORPHINE SULFATE (PF) 4 MG/ML IV SOLN
INTRAVENOUS | Status: AC
  Filled 2024-10-21: qty 1

## 2024-10-21 MED ORDER — LACTATED RINGERS IV SOLN: INTRAVENOUS | Status: DC

## 2024-10-21 MED ORDER — ONDANSETRON HCL 4 MG/2ML IJ SOLN
INTRAMUSCULAR | Status: DC | PRN
  Administered 2024-10-21: 4 mg via INTRAVENOUS

## 2024-10-21 MED ORDER — BUPIVACAINE HCL (PF) 0.25 % IJ SOLN
INTRAMUSCULAR | Status: DC | PRN
  Administered 2024-10-21: 30 mL

## 2024-10-21 MED ORDER — PROPOFOL 10 MG/ML IV BOLUS
INTRAVENOUS | Status: AC
  Filled 2024-10-21: qty 20

## 2024-10-21 MED ORDER — MIDAZOLAM HCL (PF) 2 MG/2ML IJ SOLN: 2.0000 mg | Freq: Once | INTRAMUSCULAR | Status: AC

## 2024-10-21 MED ORDER — OXYCODONE HCL 5 MG PO TABS: 5.0000 mg | ORAL_TABLET | ORAL | 0 refills | Status: AC | PRN

## 2024-10-21 MED ORDER — POVIDONE-IODINE 10 % EX SWAB: 2.0000 | Freq: Once | CUTANEOUS | Status: DC

## 2024-10-21 MED ORDER — CEFAZOLIN SODIUM-DEXTROSE 2-4 GM/100ML-% IV SOLN
2.0000 g | INTRAVENOUS | Status: AC
  Administered 2024-10-21: 2 g via INTRAVENOUS
  Filled 2024-10-21: qty 100

## 2024-10-21 MED ORDER — CLONIDINE HCL (ANALGESIA) 100 MCG/ML EP SOLN
EPIDURAL | Status: AC
  Filled 2024-10-21: qty 10

## 2024-10-21 MED ORDER — ROCURONIUM BROMIDE 100 MG/10ML IV SOLN
INTRAVENOUS | Status: DC | PRN
  Administered 2024-10-21: 60 mg via INTRAVENOUS

## 2024-10-21 MED ORDER — ACETAMINOPHEN 10 MG/ML IV SOLN
INTRAVENOUS | Status: AC
  Filled 2024-10-21: qty 100

## 2024-10-21 MED ORDER — MIDAZOLAM HCL 5 MG/5ML IJ SOLN
INTRAMUSCULAR | Status: DC | PRN
  Administered 2024-10-21: 2 mg via INTRAVENOUS

## 2024-10-21 MED ORDER — MORPHINE SULFATE (PF) 4 MG/ML IV SOLN
INTRAVENOUS | Status: DC | PRN
  Administered 2024-10-21: 8 mg via INTRAVENOUS

## 2024-10-21 MED ORDER — ACETAMINOPHEN 10 MG/ML IV SOLN
1000.0000 mg | Freq: Once | INTRAVENOUS | Status: DC | PRN
  Administered 2024-10-21: 1000 mg via INTRAVENOUS

## 2024-10-21 MED ORDER — BUPIVACAINE HCL (PF) 0.25 % IJ SOLN
INTRAMUSCULAR | Status: AC
  Filled 2024-10-21: qty 30

## 2024-10-21 MED ORDER — OXYCODONE HCL 5 MG/5ML PO SOLN: 5.0000 mg | Freq: Once | ORAL | Status: AC | PRN

## 2024-10-21 MED ORDER — CHLORHEXIDINE GLUCONATE 0.12 % MT SOLN
15.0000 mL | OROMUCOSAL | Status: AC
  Administered 2024-10-21: 15 mL via OROMUCOSAL
  Filled 2024-10-21: qty 15

## 2024-10-21 MED ORDER — AMISULPRIDE (ANTIEMETIC) 5 MG/2ML IV SOLN: 10.0000 mg | Freq: Once | INTRAVENOUS | Status: DC | PRN

## 2024-10-21 MED ORDER — PHENYLEPHRINE 80 MCG/ML (10ML) SYRINGE FOR IV PUSH (FOR BLOOD PRESSURE SUPPORT)
PREFILLED_SYRINGE | INTRAVENOUS | Status: AC
  Filled 2024-10-21: qty 10

## 2024-10-21 MED ORDER — CLONIDINE HCL (ANALGESIA) 100 MCG/ML EP SOLN
EPIDURAL | Status: DC | PRN
  Administered 2024-10-21: 1 mL

## 2024-10-21 MED ORDER — DEXAMETHASONE SOD PHOSPHATE PF 10 MG/ML IJ SOLN
INTRAMUSCULAR | Status: AC
  Filled 2024-10-21: qty 1

## 2024-10-21 MED ORDER — TRANEXAMIC ACID-NACL 1000-0.7 MG/100ML-% IV SOLN
1000.0000 mg | INTRAVENOUS | Status: AC
  Administered 2024-10-21: 1000 mg via INTRAVENOUS
  Filled 2024-10-21: qty 100

## 2024-10-21 MED ORDER — SUGAMMADEX SODIUM 200 MG/2ML IV SOLN
INTRAVENOUS | Status: DC | PRN
  Administered 2024-10-21: 200 mg via INTRAVENOUS

## 2024-10-21 MED ORDER — 0.9 % SODIUM CHLORIDE (POUR BTL) OPTIME
TOPICAL | Status: DC | PRN
  Administered 2024-10-21: 3000 mL
  Administered 2024-10-21: 1000 mL

## 2024-10-21 MED ORDER — OXYCODONE HCL 5 MG PO TABS
5.0000 mg | ORAL_TABLET | Freq: Once | ORAL | Status: AC | PRN
  Administered 2024-10-21: 5 mg via ORAL

## 2024-10-21 MED ORDER — ROCURONIUM BROMIDE 10 MG/ML (PF) SYRINGE
PREFILLED_SYRINGE | INTRAVENOUS | Status: AC
  Filled 2024-10-21: qty 10

## 2024-10-21 MED ORDER — ASPIRIN 81 MG PO CHEW: 81.0000 mg | CHEWABLE_TABLET | Freq: Two times a day (BID) | ORAL | 0 refills | Status: AC

## 2024-10-21 MED ORDER — DEXAMETHASONE SOD PHOSPHATE PF 10 MG/ML IJ SOLN
INTRAMUSCULAR | Status: DC | PRN
  Administered 2024-10-21: 10 mg via INTRAVENOUS

## 2024-10-21 MED ORDER — MIDAZOLAM HCL 2 MG/2ML IJ SOLN
INTRAMUSCULAR | Status: AC
  Administered 2024-10-21: 2 mg via INTRAVENOUS
  Filled 2024-10-21: qty 2

## 2024-10-21 MED ORDER — FENTANYL CITRATE (PF) 100 MCG/2ML IJ SOLN
25.0000 ug | INTRAMUSCULAR | Status: DC | PRN
  Administered 2024-10-21: 25 ug via INTRAVENOUS

## 2024-10-21 MED ORDER — VANCOMYCIN HCL 1000 MG IV SOLR
INTRAVENOUS | Status: DC | PRN
  Administered 2024-10-21: 1000 mg

## 2024-10-21 MED ORDER — DEXMEDETOMIDINE HCL IN NACL 80 MCG/20ML IV SOLN
INTRAVENOUS | Status: DC | PRN
  Administered 2024-10-21: 12 ug via INTRAVENOUS
  Administered 2024-10-21: 4 ug via INTRAVENOUS

## 2024-10-21 NOTE — Anesthesia Procedure Notes (Addendum)
 Procedure Name: Intubation Date/Time: 10/21/2024 12:10 PM  Performed by: Hedy Jarred, CRNAPre-anesthesia Checklist: Patient identified, Emergency Drugs available, Suction available and Patient being monitored Patient Re-evaluated:Patient Re-evaluated prior to induction Oxygen Delivery Method: Circle System Utilized Preoxygenation: Pre-oxygenation with 100% oxygen Induction Type: IV induction Ventilation: Mask ventilation without difficulty Laryngoscope Size: Miller and 3 Grade View: Grade I Tube type: Oral Tube size: 8.0 mm Number of attempts: 1 Airway Equipment and Method: Stylet and Oral airway Placement Confirmation: ETT inserted through vocal cords under direct vision, positive ETCO2 and breath sounds checked- equal and bilateral Secured at: 23 cm Tube secured with: Tape Dental Injury: Teeth and Oropharynx as per pre-operative assessment

## 2024-10-21 NOTE — Brief Op Note (Signed)
" ° °  10/21/2024  2:39 PM  PATIENT:  Gavin Wiggins  49 y.o. male  PRE-OPERATIVE DIAGNOSIS:  right quadriceps rupture  POST-OPERATIVE DIAGNOSIS:  right quadriceps rupture  PROCEDURE:  Procedures: REPAIR, TENDON, QUADRICEPS  SURGEON:  Surgeon(s): Addie, Cordella Hamilton, MD  ASSISTANT: magnant pa  ANESTHESIA:   general  EBL: 25 ml    No intake/output data recorded.  BLOOD ADMINISTERED: none  DRAINS: none   LOCAL MEDICATIONS USED: Marcaine  morphine  clonidine  vancomycin  powder  SPECIMEN:  No Specimen  COUNTS:  YES  TOURNIQUET:   Total Tourniquet Time Documented: Thigh (Right) - 74 minutes Total: Thigh (Right) - 74 minutes   DICTATION: .Other Dictation: Dictation Number done  PLAN OF CARE: Discharge to home after PACU  PATIENT DISPOSITION:  PACU - hemodynamically stable              "

## 2024-10-21 NOTE — Transfer of Care (Signed)
 Immediate Anesthesia Transfer of Care Note  Patient: Gavin Wiggins  Procedure(s) Performed: REPAIR, TENDON, QUADRICEPS (Right)  Patient Location: PACU  Anesthesia Type:General  Level of Consciousness: drowsy  Airway & Oxygen Therapy: Patient Spontanous Breathing and Patient connected to face mask oxygen  Post-op Assessment: Report given to RN and Post -op Vital signs reviewed and stable  Post vital signs: Reviewed and stable  Last Vitals:  Vitals Value Taken Time  BP 123/65 10/21/24 14:45  Temp 36.2 C 10/21/24 14:34  Pulse 86 10/21/24 14:49  Resp 20 10/21/24 14:49  SpO2 98 % 10/21/24 14:49  Vitals shown include unfiled device data.  Last Pain:  Vitals:   10/21/24 1145  TempSrc:   PainSc: 0-No pain      Patients Stated Pain Goal: 0 (10/21/24 0926)  Complications: No notable events documented.

## 2024-10-21 NOTE — Op Note (Signed)
 NAMEKEVONTA, PHARISS MEDICAL RECORD NO: [id number] ACCOUNT NO: [id number] DATE OF BIRTH: 1976-08-28 FACILITY: [facility] LOCATION: [facility] PHYSICIAN: Cordella RAMAN. Addie, MD  Operative Report   DATE OF PROCEDURE: 10/21/2024  PREOPERATIVE DIAGNOSIS:  Right near complete quadriceps tendon rupture.  POSTOPERATIVE DIAGNOSIS:  Right near complete quadriceps tendon rupture.  PROCEDURE:  Right quad tendon rupture repair using bone tunnels.  SURGEON:  Cordella RAMAN. Addie, MD  ASSISTANT:  Herlene Calix, PA.  INDICATIONS:  The patient is a 49 year old patient with right near complete quadriceps tendon rupture, which is a month old.  Presents now for operative management after explanation of risks and benefits.  INDICATIONS:  The patient is a 49 year old patient with right quadriceps tendon rupture which is a month old presents now for operative management after explanation of risks and benefits.  PROCEDURE DETAIL:  Patient was brought to the operating room where general anesthetic was induced.  Preoperative antibiotics were administered.  Timeout was called.  Right leg was prescrubbed with alcohol and Betadine , allowed to air dry, prepped with  DuraPrep solution, and draped in a sterile manner.  Ioban was used to cover the operative field.  The leg was elevated and exsanguinated with an Esmarch wrap.  Tourniquet was inflated.  Anterior incision to the knee was made.  Skin and subcutaneous tissue were sharply divided.  The peritenon fascial flap developed over the patella for later closure.  There was detachment with 2 cm of retraction of the majority of the quad tendon  including the vastus medialis and most of the rectus.  A part of the inferior rectus remained attached along with the vastus lateralis.  The retinaculum was only torn about 5 mm off each medial and lateral aspect around the patella.  The injury was more  of a delamination injury with fair amount of the quad tendon anteriorly remaining attached  to the patella.  Nonetheless, we placed 3 drill holes through the patella itself using 2.0 mm drill bits, exiting distally.  We then placed 1.6 mm suture tapes in grasping fashion x2 through the quad tendon.  They exited equidistant at the detachment site of the quad  tendon.  The quad tendon edge itself was roughened up and debrided for better attachment.  In a similar manner, the bone region for the tunnels was prepared similarly.  Thorough irrigation was performed.  At this time, after drilling the tunnels, we  placed rip-stop sutures in inverted mattress fashion through the undersurface of the posterior rectus quad tendon, which was still attached.  We then reduced the superior majority of the quad tendon to the patella itself and then brought up those 2 sutures through the quad tendon, which had been prepared with the grasping sutures.  At this time, the sutures were passed through the drill holes  and tied.  We also tied the undersurface rip-stop sutures which closed down the delaminated defect.  We oversewed that area using #1 Vicryl sutures.  All in all, a very secure and stable construct was achieved.  Knee was taken through a range of motion, found to have no gapping up to 45 degrees of flexion.  At this time, the tourniquet was released.  After thorough irrigation and placement of Vanc powder, the peritenon fascia was closed over the quad tendon.  We anesthetized the skin edges using Marcaine , morphine , and clonidine .  Also injected some of that  into the knee joint itself.  Next, skin was closed using 0 Vicryl suture, 2-0 Vicryl suture, and 3-0 Monocryl  with Steri-Strips, Aquacel dressing, and well padded posterior splint with knee immobilizer placed. The patient will be partial weightbearing  with the leg straight with crutches.  He will follow up with us  in a week.  Aspirin  for DVT prophylaxis.  Luke's assistance was required at all times for retraction, opening, closing, mobilization of  tissue.  His assistance was a medical necessity.   PUS D: 10/21/2024 2:48:28 pm T: 10/21/2024 3:11:00 pm  JOB: [id number]/ [id number]

## 2024-10-21 NOTE — Anesthesia Preprocedure Evaluation (Addendum)
"                                    Anesthesia Evaluation  Patient identified by MRN, date of birth, ID band Patient awake    Reviewed: Allergy & Precautions, NPO status , Patient's Chart, lab work & pertinent test results  Airway Mallampati: II  TM Distance: >3 FB Neck ROM: Full    Dental no notable dental hx.    Pulmonary sleep apnea and Continuous Positive Airway Pressure Ventilation , Current Smoker and Patient abstained from smoking.   Pulmonary exam normal        Cardiovascular negative cardio ROS Normal cardiovascular exam     Neuro/Psych negative neurological ROS  negative psych ROS   GI/Hepatic negative GI ROS, Neg liver ROS,,,  Endo/Other  negative endocrine ROS    Renal/GU negative Renal ROS     Musculoskeletal   Abdominal  (+) + obese  Peds  Hematology negative hematology ROS (+)   Anesthesia Other Findings right quadriceps rupture  Reproductive/Obstetrics                              Anesthesia Physical Anesthesia Plan  ASA: 2  Anesthesia Plan: General and Regional   Post-op Pain Management: Regional block*   Induction: Intravenous  PONV Risk Score and Plan: 1 and Ondansetron , Dexamethasone , Midazolam  and Treatment may vary due to age or medical condition  Airway Management Planned: Oral ETT  Additional Equipment:   Intra-op Plan:   Post-operative Plan: Extubation in OR  Informed Consent: I have reviewed the patients History and Physical, chart, labs and discussed the procedure including the risks, benefits and alternatives for the proposed anesthesia with the patient or authorized representative who has indicated his/her understanding and acceptance.     Dental advisory given  Plan Discussed with: CRNA  Anesthesia Plan Comments:          Anesthesia Quick Evaluation  "

## 2024-10-21 NOTE — Anesthesia Procedure Notes (Signed)
 Anesthesia Regional Block: Adductor canal block   Pre-Anesthetic Checklist: , timeout performed,  Correct Patient, Correct Site, Correct Laterality,  Correct Procedure,, site marked,  Risks and benefits discussed,  Surgical consent,  Pre-op evaluation,  At surgeon's request and post-op pain management  Laterality: Right  Prep: chloraprep       Needles:  Injection technique: Single-shot  Needle Type: Echogenic Stimulator Needle     Needle Length: 10cm  Needle Gauge: 20     Additional Needles:   Procedures:,,,, ultrasound used (permanent image in chart),,    Narrative:  Start time: 10/21/2024 11:35 AM End time: 10/21/2024 11:45 AM Injection made incrementally with aspirations every 5 mL.  Performed by: Personally  Anesthesiologist: Patrisha Bernardino SQUIBB, MD  Additional Notes: Functioning IV was confirmed and monitors were applied. A time-out was performed. Hand hygiene and sterile gloves were used. The thigh was placed in a frog-leg position and prepped in a sterile fashion. A 20ga Bbraun echogenic stimulator needle was placed using ultrasound guidance.  Negative aspiration and negative test dose prior to incremental administration of local anesthetic. The patient tolerated the procedure well.

## 2024-10-21 NOTE — Anesthesia Postprocedure Evaluation (Signed)
"   Anesthesia Post Note  Patient: Gavin Wiggins  Procedure(s) Performed: REPAIR, TENDON, QUADRICEPS (Right)     Patient location during evaluation: PACU Anesthesia Type: General Level of consciousness: awake Pain management: pain level controlled Vital Signs Assessment: post-procedure vital signs reviewed and stable Respiratory status: spontaneous breathing Cardiovascular status: blood pressure returned to baseline Postop Assessment: no apparent nausea or vomiting Anesthetic complications: no   No notable events documented.  Last Vitals:  Vitals:   10/21/24 1530 10/21/24 1545  BP: 123/69 116/83  Pulse: 74 80  Resp: 11 12  Temp:  36.5 C  SpO2: 93% 98%    Last Pain:  Vitals:   10/21/24 1545  TempSrc:   PainSc: 3                  [name]      "

## 2024-10-21 NOTE — H&P (Signed)
 Gavin Wiggins is an 49 y.o. male.   Chief Complaint: Right leg weakness HPI: Gavin Wiggins is an active 49 year old patient with right knee pain.  Sustained an injury on 09/18/2024.  Reported weakness after that injury.  Subsequent MRI scan demonstrates majority of the quadriceps tendon torn and retracted about 2 cm from the superior pole of the patella.  No other intra-articular injury present.  He does report weakness as well as difficulty going up and down stairs.  No personal or family history of DVT or pulmonary embolism.  Past Medical History:  Diagnosis Date   Sleep apnea     History reviewed. No pertinent surgical history.  History reviewed. No pertinent family history. Social History:  reports that he has been smoking cigars. He has never used smokeless tobacco. He reports current alcohol use. He reports that he does not use drugs.  Allergies: Allergies[1]  Medications Prior to Admission  Medication Sig Dispense Refill   HYDROcodone -acetaminophen  (NORCO/VICODIN) 5-325 MG tablet Take 1 tablet by mouth every 6 (six) hours as needed for severe pain (pain score 7-10). 10 tablet 0   ibuprofen  (ADVIL ) 600 MG tablet Take 1 tablet (600 mg total) by mouth every 6 (six) hours as needed. 30 tablet 0   methocarbamol  (ROBAXIN ) 750 MG tablet Take 1 tablet (750 mg total) by mouth 4 (four) times daily. (Patient taking differently: Take 750 mg by mouth every 8 (eight) hours as needed for muscle spasms.) 28 tablet 0    Results for orders placed or performed during the hospital encounter of 10/21/24 (from the past 48 hours)  CBC     Status: Abnormal   Collection Time: 10/21/24  9:49 AM  Result Value Ref Range   WBC 3.6 (L) 4.0 - 10.5 K/uL   RBC 4.21 (L) 4.22 - 5.81 MIL/uL   Hemoglobin 13.6 13.0 - 17.0 g/dL   HCT 59.7 60.9 - 47.9 %   MCV 95.5 80.0 - 100.0 fL   MCH 32.3 26.0 - 34.0 pg   MCHC 33.8 30.0 - 36.0 g/dL   RDW 87.3 88.4 - 84.4 %   Platelets 181 150 - 400 K/uL   nRBC 0.0 0.0 - 0.2 %     Comment: Performed at [facility], [address] [place], [place] [address]   No results found.  Review of Systems  Musculoskeletal:  Positive for arthralgias.  All other systems reviewed and are negative.   Blood pressure 135/89, pulse 71, temperature 98.9 F (37.2 C), temperature source Oral, resp. rate 20, height 5' 10 (1.778 m), weight 110.2 kg, SpO2 99%. Physical Exam Vitals reviewed.  HENT:     Head: Normocephalic.     Nose: Nose normal.     Mouth/Throat:     Mouth: Mucous membranes are moist.  Eyes:     Pupils: Pupils are equal, round, and reactive to light.  Cardiovascular:     Rate and Rhythm: Normal rate.     Pulses: Normal pulses.  Pulmonary:     Effort: Pulmonary effort is normal.  Abdominal:     General: Abdomen is flat.  Musculoskeletal:     Cervical back: Normal range of motion.  Skin:    General: Skin is warm.     Capillary Refill: Capillary refill takes less than 2 seconds.  Neurological:     General: No focal deficit present.     Mental Status: He is alert.  Psychiatric:        Mood and Affect: Mood normal.  Right knee exam demonstrates extensor lag of about 15 degrees.  Gap is palpable at the superior pole of the patella about 2 cm.  Collateral crucial ligaments are stable.  Trace effusion in the knee is present.  No masses lymphadenopathy or skin changes noted in that right knee region.  Assessment/Plan Impression is near full-thickness tear quadriceps tendon on the right-hand side from its proximal patellar attachment.  Plan is mobilization of the tendon.  The injury currently is about 4 weeks old.  We will reach reattach the tendon back to the superior pole of the patella using either bone tunnels or suture anchors.  May require reinforcement depending on tension on the repair.  The risk and benefits are discussed with the patient including not limited to infection or vessel damage incomplete healing incomplete restoration of strength as well  as potential for rerupture.  Will keep him straight for the right leg for at least 3 weeks after surgery.  All questions answered.  May require reinforcement with dermal allograft depending on tension on the repair.  All questions answered.  KANDICE Glendia Hutchinson, MD 10/21/2024, 11:06 AM       [1] No Known Allergies

## 2024-10-22 ENCOUNTER — Encounter (HOSPITAL_COMMUNITY): Payer: Self-pay | Admitting: Orthopedic Surgery

## 2024-10-25 DIAGNOSIS — S76111D Strain of right quadriceps muscle, fascia and tendon, subsequent encounter: Secondary | ICD-10-CM

## 2024-10-30 HISTORY — DX: Sleep apnea, unspecified: G47.30

## 2024-10-31 ENCOUNTER — Ambulatory Visit: Admitting: Surgical

## 2024-11-14 ENCOUNTER — Encounter: Admitting: Surgical
